# Patient Record
Sex: Male | Born: 1955 | Race: White | Hispanic: No | Marital: Single | Smoking: Never smoker
Health system: Southern US, Community
[De-identification: ages and names within clinical notes are randomized; demographics above are authoritative.]

## PROBLEM LIST (undated history)

## (undated) DIAGNOSIS — I4891 Unspecified atrial fibrillation: Secondary | ICD-10-CM

## (undated) DIAGNOSIS — F329 Major depressive disorder, single episode, unspecified: Secondary | ICD-10-CM

## (undated) DIAGNOSIS — J45909 Unspecified asthma, uncomplicated: Secondary | ICD-10-CM

## (undated) DIAGNOSIS — F32A Depression, unspecified: Secondary | ICD-10-CM

## (undated) DIAGNOSIS — I10 Essential (primary) hypertension: Secondary | ICD-10-CM

## (undated) DIAGNOSIS — Z950 Presence of cardiac pacemaker: Secondary | ICD-10-CM

## (undated) HISTORY — PX: PACEMAKER PLACEMENT: SHX43

## (undated) HISTORY — PX: BELOW KNEE LEG AMPUTATION: SUR23

## (undated) HISTORY — PX: HERNIA REPAIR: SHX51

---

## 2016-01-21 ENCOUNTER — Emergency Department: Payer: Medicare Other

## 2016-01-21 ENCOUNTER — Emergency Department
Admission: EM | Admit: 2016-01-21 | Discharge: 2016-01-21 | Disposition: A | Discharge status: Home / Self Care | Payer: Medicare Other | Attending: Emergency Medicine | Admitting: Emergency Medicine

## 2016-01-21 ENCOUNTER — Encounter: Payer: Self-pay | Admitting: Emergency Medicine

## 2016-01-21 DIAGNOSIS — I10 Essential (primary) hypertension: Secondary | ICD-10-CM | POA: Insufficient documentation

## 2016-01-21 DIAGNOSIS — Z950 Presence of cardiac pacemaker: Secondary | ICD-10-CM | POA: Diagnosis not present

## 2016-01-21 DIAGNOSIS — I4891 Unspecified atrial fibrillation: Secondary | ICD-10-CM | POA: Insufficient documentation

## 2016-01-21 DIAGNOSIS — F329 Major depressive disorder, single episode, unspecified: Secondary | ICD-10-CM | POA: Insufficient documentation

## 2016-01-21 DIAGNOSIS — J45909 Unspecified asthma, uncomplicated: Secondary | ICD-10-CM | POA: Diagnosis not present

## 2016-01-21 DIAGNOSIS — Z760 Encounter for issue of repeat prescription: Secondary | ICD-10-CM

## 2016-01-21 DIAGNOSIS — R0602 Shortness of breath: Secondary | ICD-10-CM | POA: Insufficient documentation

## 2016-01-21 HISTORY — DX: Unspecified atrial fibrillation: I48.91

## 2016-01-21 HISTORY — DX: Unspecified asthma, uncomplicated: J45.909

## 2016-01-21 HISTORY — DX: Essential (primary) hypertension: I10

## 2016-01-21 HISTORY — DX: Presence of cardiac pacemaker: Z95.0

## 2016-01-21 HISTORY — DX: Depression, unspecified: F32.A

## 2016-01-21 HISTORY — DX: Major depressive disorder, single episode, unspecified: F32.9

## 2016-01-21 LAB — BASIC METABOLIC PANEL
ANION GAP: 8 (ref 5–15)
BUN: 22 mg/dL — ABNORMAL HIGH (ref 6–20)
CALCIUM: 9.2 mg/dL (ref 8.9–10.3)
CO2: 24 mmol/L (ref 22–32)
CREATININE: 1.15 mg/dL (ref 0.61–1.24)
Chloride: 104 mmol/L (ref 101–111)
GFR calc non Af Amer: >60 mL/min (ref >60)
Glucose, Bld: 86 mg/dL (ref 65–99)
Potassium: 4 mmol/L (ref 3.5–5.1)
SODIUM: 136 mmol/L (ref 135–145)

## 2016-01-21 LAB — CBC
HCT: 40.3 % (ref 40.0–52.0)
HEMOGLOBIN: 13.6 g/dL (ref 13.0–18.0)
MCH: 29.5 pg (ref 26.0–34.0)
MCHC: 33.8 g/dL (ref 32.0–36.0)
MCV: 87.4 fL (ref 80.0–100.0)
PLATELETS: 213 10*3/uL (ref 150–440)
RBC: 4.61 MIL/uL (ref 4.40–5.90)
RDW: 14.3 % (ref 11.5–14.5)
WBC: 10.8 10*3/uL — AB (ref 3.8–10.6)

## 2016-01-21 LAB — TROPONIN I

## 2016-01-21 MED ORDER — QUETIAPINE FUMARATE 100 MG PO TABS: 100.0000 mg | ORAL_TABLET | Freq: Every day | ORAL | Status: AC

## 2016-01-21 MED ORDER — METOPROLOL TARTRATE 50 MG PO TABS: 50.0000 mg | ORAL_TABLET | Freq: Two times a day (BID) | ORAL | Status: AC

## 2016-01-21 MED ORDER — ALBUTEROL SULFATE (2.5 MG/3ML) 0.083% IN NEBU
INHALATION_SOLUTION | RESPIRATORY_TRACT | Status: AC
  Filled 2016-01-21: qty 6

## 2016-01-21 MED ORDER — COMPRESSOR/NEBULIZER MISC: 1.0000 [IU] | Status: AC

## 2016-01-21 MED ORDER — IPRATROPIUM-ALBUTEROL 0.5-2.5 (3) MG/3ML IN SOLN
3.0000 mL | Freq: Once | RESPIRATORY_TRACT | Status: AC
  Administered 2016-01-21: 3 mL via RESPIRATORY_TRACT
  Filled 2016-01-21 (×2): qty 3

## 2016-01-21 MED ORDER — ALBUTEROL SULFATE (2.5 MG/3ML) 0.083% IN NEBU: 2.5000 mg | INHALATION_SOLUTION | Freq: Four times a day (QID) | RESPIRATORY_TRACT | Status: AC | PRN

## 2016-01-21 MED ORDER — LISINOPRIL 40 MG PO TABS: 40.0000 mg | ORAL_TABLET | Freq: Every day | ORAL | Status: AC

## 2016-01-21 MED ORDER — ALBUTEROL SULFATE (2.5 MG/3ML) 0.083% IN NEBU
5.0000 mg | INHALATION_SOLUTION | Freq: Once | RESPIRATORY_TRACT | Status: AC
Start: 2016-01-21 — End: 2016-01-21
  Administered 2016-01-21: 5 mg via RESPIRATORY_TRACT

## 2016-01-21 NOTE — ED Provider Notes (Signed)
[facility] Emergency Department Provider Note  Time seen: 4:17 PM  I have reviewed the triage vital signs and the nursing notes.   HISTORY  Chief Complaint Shortness of Breath    HPI Sean Martin is a 60 y.o. male with a past medical history of hypertension, age of fibrillation, asthma, pacemaker, who presents the emergency department out of his medications with some shortness of breath. According to the patient he recently traveled to the area and will be staying in [place] for several weeks. He states he lost his luggage which contained his medications, he does not have a doctor in this area as he lives in [place]. Patient states mild shortness of breath but feels this is because he has not had his nebulized albuterol for the last several days. Denies any chest pain. States he feels like he just needs a breathing treatment and some prescription refills. He will follow up with a primary care doctor in the area of we are able to refer him to one.     Past Medical History  Diagnosis Date  . Hypertension   . Pacemaker   . A-fib (HCC)   . Asthma   . Depression     There are no active problems to display for this patient.   Past Surgical History  Procedure Laterality Date  . Pacemaker placement    . Below knee leg amputation    . Hernia repair      No current outpatient prescriptions on file.  Allergies Sulfa antibiotics  No family history on file.  Social History Social History  Substance Use Topics  . Smoking status: Never Smoker   . Smokeless tobacco: None  . Alcohol Use: No    Review of Systems Constitutional: Negative for fever. Cardiovascular: Negative for chest pain. Respiratory: Mild shortness of breath. Gastrointestinal: Negative for abdominal pain Genitourinary: Negative for dysuria. Musculoskeletal: Negative for back pain. Neurological: Negative for headache 10-point ROS otherwise  negative.  ____________________________________________   PHYSICAL EXAM:  VITAL SIGNS: ED Triage Vitals  Enc Vitals Group     BP 01/21/16 1510 161/93 mmHg     Pulse Rate 01/21/16 1510 83     Resp 01/21/16 1510 20     Temp 01/21/16 1510 98.2 F (36.8 C)     Temp Source 01/21/16 1510 Oral     SpO2 01/21/16 1510 97 %     Weight 01/21/16 1510 205 lb (92.987 kg)     Height 01/21/16 1510 6' (1.829 m)     Head Cir --      Peak Flow --      Pain Score --      Pain Loc --      Pain Edu? --      Excl. in GC? --     Constitutional: Alert and oriented. Well appearing and in no distress. Eyes: Normal exam ENT   Head: Normocephalic and atraumatic.   Mouth/Throat: Mucous membranes are moist. Cardiovascular: Normal rate, regular rhythm. No murmurs, rubs, or gallops. Respiratory: Normal respiratory effort without tachypnea nor retractions. Breath sounds are clear Gastrointestinal: Soft and nontender. No distention.   Musculoskeletal: Nontender with normal range of motion in all extremities. No lower extremity tenderness or edema.Right lower extremity prosthetic. Neurologic:  Normal speech and language. No gross focal neurologic deficits Skin:  Skin is warm, dry and intact.  Psychiatric: Mood and affect are normal.  ____________________________________________    EKG  EKG reviewed and interpreted by myself shows an atrial  paced rhythm at 63 bpm, narrow QRS, normal axis, normal intervals, no ST changes.  ____________________________________________    RADIOLOGY  Chest x-ray negative for acute findings.   INITIAL IMPRESSION / ASSESSMENT AND PLAN / ED COURSE  Pertinent labs & imaging results that were available during my care of the patient were reviewed by me and considered in my medical decision making (see chart for details).  Patient presents to department mild shortness breath. Patient's labs within normal limits. Troponin negative. Chest x-ray negative. EKG is  reassuring. Patient states he feels much better after breathing treatment. He states he is out of all his medications and is hoping for refills on his medications until he can see her primary care doctor in the area as he is from [place]. I will discharge the patient with his normal medications, he will follow-up with [facility] clinic. Patient is agreeable to this plan.  ____________________________________________   FINAL CLINICAL IMPRESSION(S) / ED DIAGNOSES  Shortness of breath Medication refill   Minna Antis, MD 01/21/16 1620

## 2016-01-21 NOTE — ED Notes (Signed)
Patient halfway through with breathing treatment, states he feels much better.

## 2016-01-21 NOTE — Discharge Instructions (Signed)

## 2016-01-21 NOTE — ED Notes (Addendum)
Patient to ER for c/o shortness of breath since yesterday. Denies any chest pain. Denies any cough or fever. States "I just need a breathing treatment".

## 2016-01-21 NOTE — ED Notes (Signed)
Pt states he needs his blood pressure medications, seroquel and xanax refilled. Pt states he does not have a PCP.

## 2016-02-09 ENCOUNTER — Emergency Department
Admission: EM | Admit: 2016-02-09 | Discharge: 2016-02-09 | Payer: Medicare Other | Attending: Emergency Medicine | Admitting: Emergency Medicine

## 2016-02-09 DIAGNOSIS — J45909 Unspecified asthma, uncomplicated: Secondary | ICD-10-CM | POA: Diagnosis not present

## 2016-02-09 DIAGNOSIS — Z950 Presence of cardiac pacemaker: Secondary | ICD-10-CM | POA: Diagnosis not present

## 2016-02-09 DIAGNOSIS — F10129 Alcohol abuse with intoxication, unspecified: Secondary | ICD-10-CM | POA: Insufficient documentation

## 2016-02-09 DIAGNOSIS — Z0289 Encounter for other administrative examinations: Secondary | ICD-10-CM | POA: Diagnosis present

## 2016-02-09 DIAGNOSIS — Z79899 Other long term (current) drug therapy: Secondary | ICD-10-CM | POA: Diagnosis not present

## 2016-02-09 DIAGNOSIS — F329 Major depressive disorder, single episode, unspecified: Secondary | ICD-10-CM | POA: Diagnosis not present

## 2016-02-09 DIAGNOSIS — F1010 Alcohol abuse, uncomplicated: Secondary | ICD-10-CM

## 2016-02-09 DIAGNOSIS — I10 Essential (primary) hypertension: Secondary | ICD-10-CM | POA: Insufficient documentation

## 2016-02-09 DIAGNOSIS — IMO0002 Reserved for concepts with insufficient information to code with codable children: Secondary | ICD-10-CM

## 2016-02-09 LAB — COMPREHENSIVE METABOLIC PANEL
ALBUMIN: 4.2 g/dL (ref 3.5–5.0)
ALK PHOS: 89 U/L (ref 38–126)
ALT: 16 U/L — ABNORMAL LOW (ref 17–63)
ANION GAP: 10 (ref 5–15)
AST: 22 U/L (ref 15–41)
BUN: 19 mg/dL (ref 6–20)
CALCIUM: 8.8 mg/dL — AB (ref 8.9–10.3)
CO2: 21 mmol/L — AB (ref 22–32)
Chloride: 109 mmol/L (ref 101–111)
Creatinine, Ser: 1.2 mg/dL (ref 0.61–1.24)
GFR calc Af Amer: >60 mL/min (ref >60)
GFR calc non Af Amer: >60 mL/min (ref >60)
GLUCOSE: 105 mg/dL — AB (ref 65–99)
Potassium: 4 mmol/L (ref 3.5–5.1)
SODIUM: 140 mmol/L (ref 135–145)
Total Bilirubin: 0.6 mg/dL (ref 0.3–1.2)
Total Protein: 7.4 g/dL (ref 6.5–8.1)

## 2016-02-09 LAB — TROPONIN I: Troponin I: <0.03 ng/mL (ref <0.031)

## 2016-02-09 LAB — CBC
HCT: 42.2 % (ref 40.0–52.0)
HEMOGLOBIN: 14.4 g/dL (ref 13.0–18.0)
MCH: 30.5 pg (ref 26.0–34.0)
MCHC: 34.2 g/dL (ref 32.0–36.0)
MCV: 89.3 fL (ref 80.0–100.0)
PLATELETS: 200 10*3/uL (ref 150–440)
RBC: 4.73 MIL/uL (ref 4.40–5.90)
RDW: 15 % — ABNORMAL HIGH (ref 11.5–14.5)
WBC: 8.1 10*3/uL (ref 3.8–10.6)

## 2016-02-09 LAB — ETHANOL
Alcohol, Ethyl (B): 331 mg/dL (ref <5)
Alcohol, Ethyl (B): 286 mg/dL — ABNORMAL HIGH (ref <5)

## 2016-02-09 MED ORDER — SODIUM CHLORIDE 0.9 % IV BOLUS (SEPSIS): 1000.0000 mL | Freq: Once | INTRAVENOUS | Status: DC
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
## 2016-02-09 NOTE — Discharge Instructions (Signed)

## 2016-02-09 NOTE — ED Notes (Signed)
Pt becomes combative, attempting to kick and pulling EKG leads off. PT refusing EKG
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
## 2016-02-09 NOTE — ED Notes (Addendum)
Troponin drawn because pt hx of heart problems and pt report of CP
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
## 2016-09-04 IMAGING — CR DG CHEST 2V
1 series · 2 of 2 positions shown · non-contrast
Comparison: None.

CLINICAL DATA: Shortness of breath since yesterday.

EXAM:
CHEST  2 VIEW

[Series 1: dg chest 2 view · 0.14mm/px · 2 of 2 slices shown]
[im 1/2]
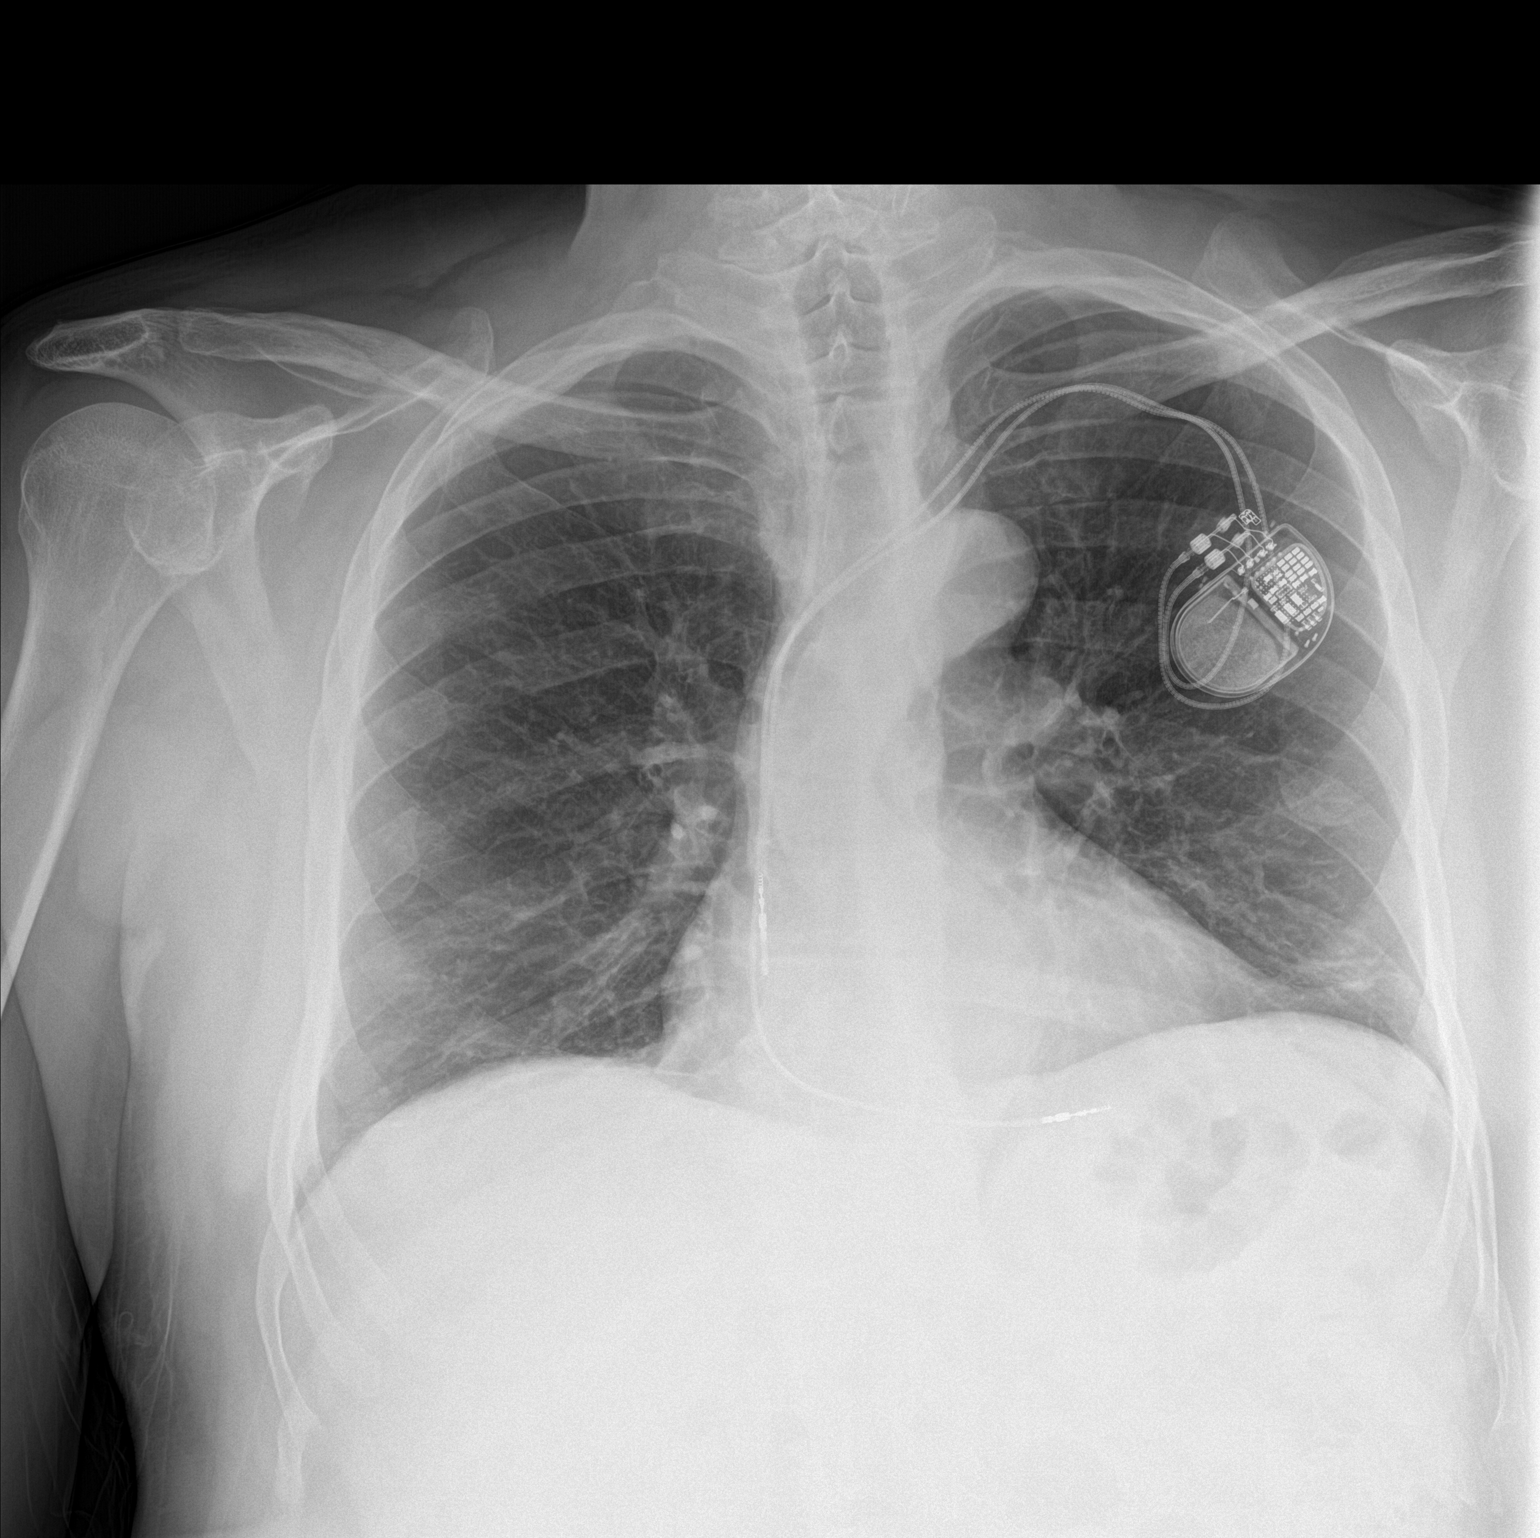
[im 2/2]
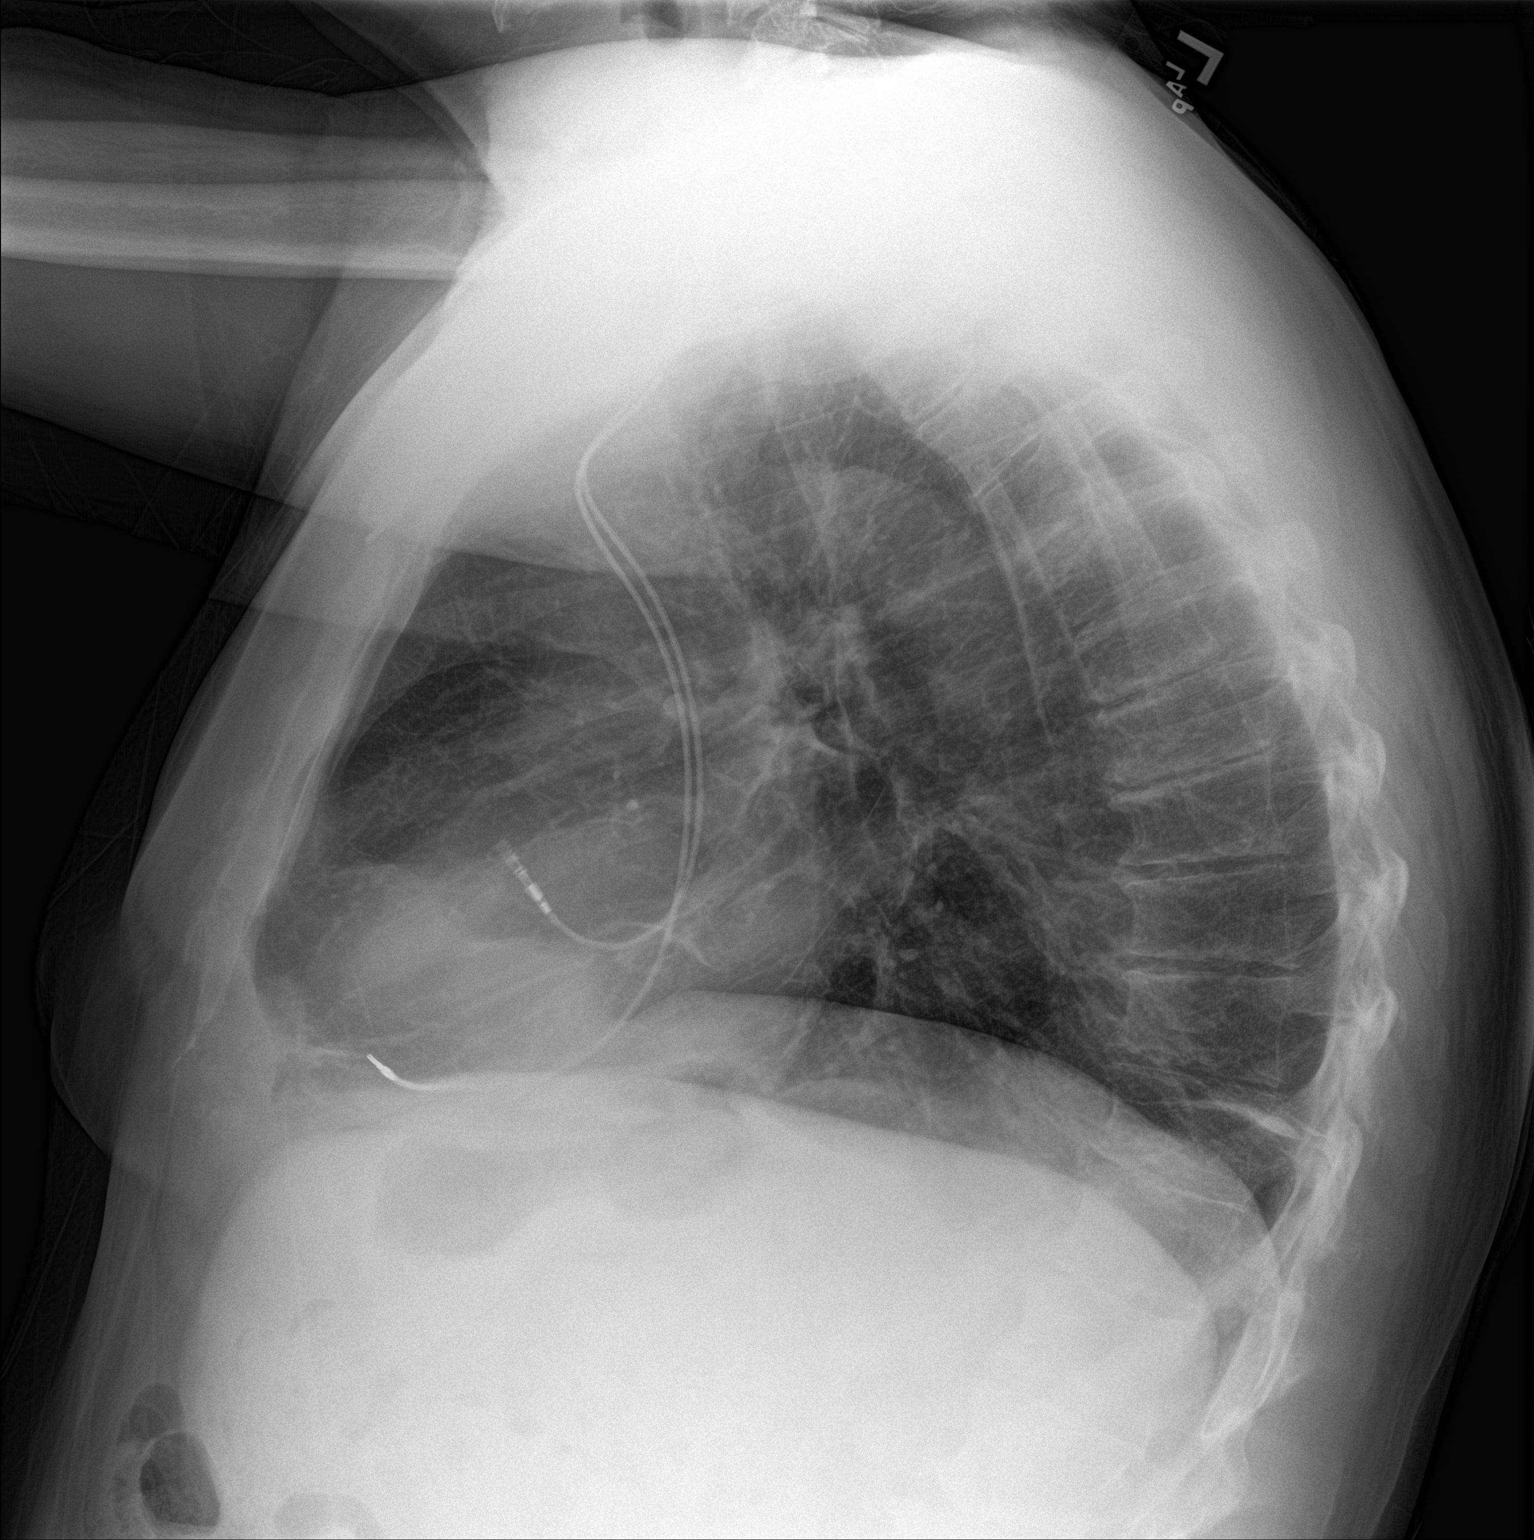

[2 of 2 positions shown; findings below may reference images not displayed]

FINDINGS: Linear densities at left lung base are suggestive for scar or
atelectasis. Otherwise, the lungs are clear. Negative for pulmonary
edema. Heart and mediastinum are within normal limits. There is a
dual-chamber left cardiac pacemaker. No large pleural effusions.
Mild degenerative endplate changes in the thoracic spine. No large
pleural effusions.
IMPRESSION: No acute chest findings.

Small amount of left basilar atelectasis or scarring.
